# Patient Record
Sex: Female | Born: 1995 | Race: Black or African American | Hispanic: No | Marital: Single | Smoking: Never smoker
Health system: Southern US, Community
[De-identification: ages and names within clinical notes are randomized; demographics above are authoritative.]

## PROBLEM LIST (undated history)

## (undated) DIAGNOSIS — D649 Anemia, unspecified: Secondary | ICD-10-CM

---

## 2016-04-09 ENCOUNTER — Encounter (HOSPITAL_COMMUNITY): Payer: Self-pay | Admitting: Emergency Medicine

## 2016-04-09 ENCOUNTER — Emergency Department (HOSPITAL_COMMUNITY)
Admission: EM | Admit: 2016-04-09 | Discharge: 2016-04-09 | Disposition: A | Discharge status: Home / Self Care | Payer: BLUE CROSS/BLUE SHIELD | Attending: Emergency Medicine | Admitting: Emergency Medicine

## 2016-04-09 ENCOUNTER — Emergency Department (HOSPITAL_COMMUNITY): Payer: BLUE CROSS/BLUE SHIELD

## 2016-04-09 DIAGNOSIS — S4992XA Unspecified injury of left shoulder and upper arm, initial encounter: Secondary | ICD-10-CM | POA: Diagnosis present

## 2016-04-09 DIAGNOSIS — Y939 Activity, unspecified: Secondary | ICD-10-CM | POA: Diagnosis not present

## 2016-04-09 DIAGNOSIS — Y999 Unspecified external cause status: Secondary | ICD-10-CM | POA: Insufficient documentation

## 2016-04-09 DIAGNOSIS — Y92410 Unspecified street and highway as the place of occurrence of the external cause: Secondary | ICD-10-CM | POA: Diagnosis not present

## 2016-04-09 DIAGNOSIS — S40012A Contusion of left shoulder, initial encounter: Secondary | ICD-10-CM

## 2016-04-09 HISTORY — DX: Anemia, unspecified: D64.9

## 2016-04-09 MED ORDER — IBUPROFEN 400 MG PO TABS
600.0000 mg | ORAL_TABLET | Freq: Once | ORAL | Status: AC
  Administered 2016-04-09: 600 mg via ORAL
  Filled 2016-04-09: qty 1

## 2016-04-09 MED ORDER — LIDOCAINE 5 % EX PTCH
1.0000 | MEDICATED_PATCH | Freq: Once | CUTANEOUS | Status: DC
  Administered 2016-04-09: 1 via TRANSDERMAL
  Filled 2016-04-09: qty 1

## 2016-04-09 NOTE — ED Notes (Signed)
Pt returned from xray
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
## 2016-04-09 NOTE — Progress Notes (Signed)
Orthopedic Tech Progress Note Patient Details:  Kathy Contreras 1995-12-21 [id number]  Ortho Devices Type of Ortho Device: Sling immobilizer Ortho Device/Splint Location: lue Ortho Device/Splint Interventions: Ordered, Application   Trinna PostMartinez, [name] 04/09/2016, 4:24 AM
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
## 2018-09-14 ENCOUNTER — Other Ambulatory Visit (HOSPITAL_COMMUNITY): Payer: Self-pay | Admitting: Family Medicine

## 2018-09-14 ENCOUNTER — Other Ambulatory Visit: Payer: Self-pay | Admitting: Family Medicine

## 2018-09-14 DIAGNOSIS — E349 Endocrine disorder, unspecified: Secondary | ICD-10-CM

## 2018-09-15 ENCOUNTER — Other Ambulatory Visit: Payer: Self-pay

## 2018-09-15 ENCOUNTER — Ambulatory Visit (HOSPITAL_COMMUNITY)
Admission: RE | Admit: 2018-09-15 | Discharge: 2018-09-15 | Disposition: A | Discharge status: Home / Self Care | Payer: BC Managed Care – PPO | Source: Ambulatory Visit | Attending: Family Medicine | Admitting: Family Medicine

## 2018-09-15 DIAGNOSIS — E349 Endocrine disorder, unspecified: Secondary | ICD-10-CM | POA: Insufficient documentation

## 2018-09-23 ENCOUNTER — Inpatient Hospital Stay (HOSPITAL_COMMUNITY)
Admission: AD | Admit: 2018-09-23 | Discharge: 2018-09-23 | Disposition: A | Discharge status: Home / Self Care | Payer: BC Managed Care – PPO | Attending: Obstetrics and Gynecology | Admitting: Obstetrics and Gynecology

## 2018-09-23 ENCOUNTER — Encounter (HOSPITAL_COMMUNITY): Payer: Self-pay

## 2018-09-23 ENCOUNTER — Other Ambulatory Visit: Payer: Self-pay

## 2018-09-23 DIAGNOSIS — R109 Unspecified abdominal pain: Secondary | ICD-10-CM

## 2018-09-23 DIAGNOSIS — N939 Abnormal uterine and vaginal bleeding, unspecified: Secondary | ICD-10-CM | POA: Diagnosis present

## 2018-09-23 DIAGNOSIS — Z3202 Encounter for pregnancy test, result negative: Secondary | ICD-10-CM | POA: Diagnosis not present

## 2018-09-23 DIAGNOSIS — Z789 Other specified health status: Secondary | ICD-10-CM

## 2018-09-23 LAB — HCG, QUANTITATIVE, PREGNANCY: hCG, Beta Chain, Quant, S: 1 m[IU]/mL (ref <5)
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
## 2018-09-23 NOTE — Discharge Instructions (Signed)
Abdominal Pain, Adult    Many things can cause belly (abdominal) pain. Most times, belly pain is not dangerous. Many cases of belly pain can be watched and treated at home. Sometimes belly pain is serious, though. Your doctor will try to find the cause of your belly pain.  Follow these instructions at home:  · Take over-the-counter and prescription medicines only as told by your doctor. Do not take medicines that help you poop (laxatives) unless told to by your doctor.  · Drink enough fluid to keep your pee (urine) clear or pale yellow.  · Watch your belly pain for any changes.  · Keep all follow-up visits as told by your doctor. This is important.  Contact a doctor if:  · Your belly pain changes or gets worse.  · You are not hungry, or you lose weight without trying.  · You are having trouble pooping (constipated) or have watery poop (diarrhea) for more than 2-3 days.  · You have pain when you pee or poop.  · Your belly pain wakes you up at night.  · Your pain gets worse with meals, after eating, or with certain foods.  · You are throwing up and cannot keep anything down.  · You have a fever.  Get help right away if:  · Your pain does not go away as soon as your doctor says it should.  · You cannot stop throwing up.  · Your pain is only in areas of your belly, such as the right side or the left lower part of the belly.  · You have bloody or black poop, or poop that looks like tar.  · You have very bad pain, cramping, or bloating in your belly.  · You have signs of not having enough fluid or water in your body (dehydration), such as:  ? Dark pee, very little pee, or no pee.  ? Cracked lips.  ? Dry mouth.  ? Sunken eyes.  ? Sleepiness.  ? Weakness.  This information is not intended to replace advice given to you by your health care provider. Make sure you discuss any questions you have with your health care provider.  Document Released: 07/08/2007 Document Revised: 08/09/2015 Document Reviewed: 07/03/2015  Elsevier  Interactive Patient Education © 2020 [employer]

## 2018-09-23 NOTE — MAU Note (Signed)
Pt presents with c/o intermittent VB that began 09/16/2018.  Reports currently not bleeding.  LMP 08/08/2018.  States had negative HPT, but positive blood HCG in [place] hospital.

## 2018-09-23 NOTE — Progress Notes (Signed)
Darryll Capers, RN, [facility] Coverage into see pt per her request after receiving complete update regarding situation.

## 2018-09-26 LAB — POCT PREGNANCY, URINE: Preg Test, Ur: NEGATIVE

## 2020-02-23 IMAGING — US OBSTETRIC <14 WK US AND TRANSVAGINAL OB US
1 series · 15 of 28 positions shown · non-contrast
Comparison: None.

CLINICAL DATA: Abdominal pain.  Positive pregnancy test.

EXAM:
OBSTETRIC <14 WK US AND TRANSVAGINAL OB US
TECHNIQUE: Both transabdominal and transvaginal ultrasound examinations were
performed for complete evaluation of the gestation as well as the
maternal uterus, adnexal regions, and pelvic cul-de-sac.
Transvaginal technique was performed to assess early pregnancy.

[Series 1: obstetric <14 wk us and transvaginal ob us · 15 of 61 slices shown]
[im 1/61]
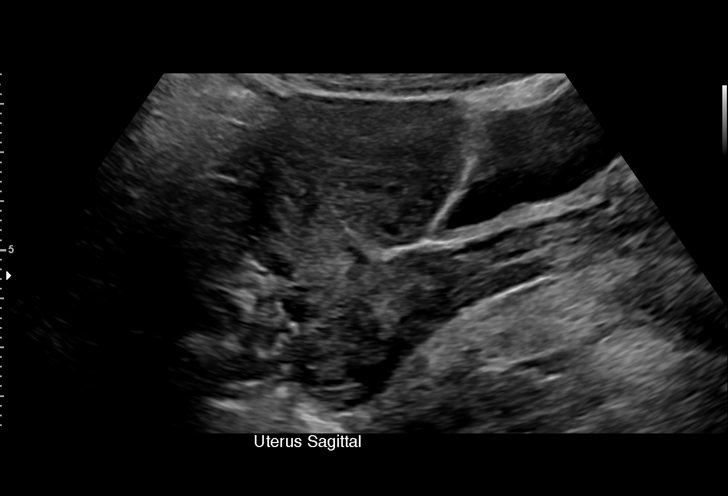
[im 5/61]
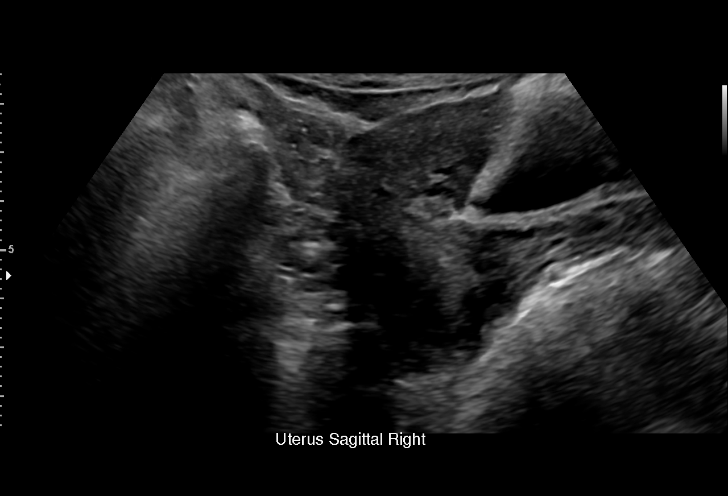
[im 9/61]
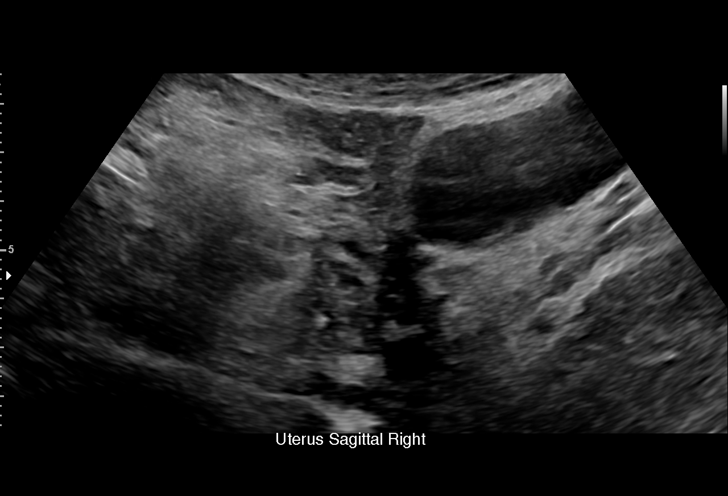
[im 14/61]
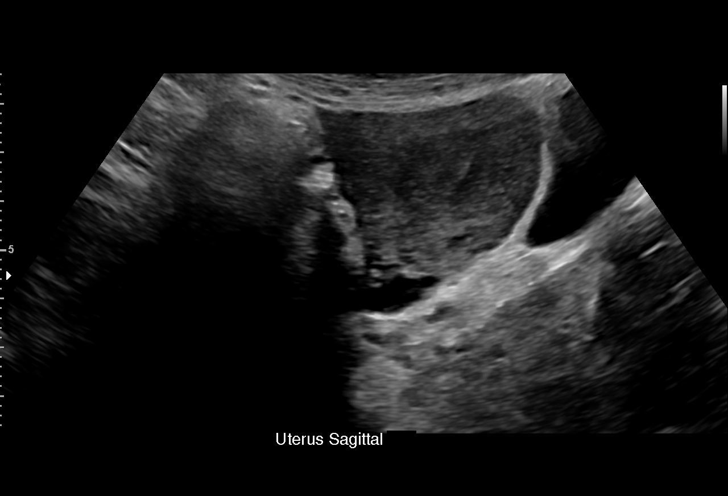
[im 18/61]
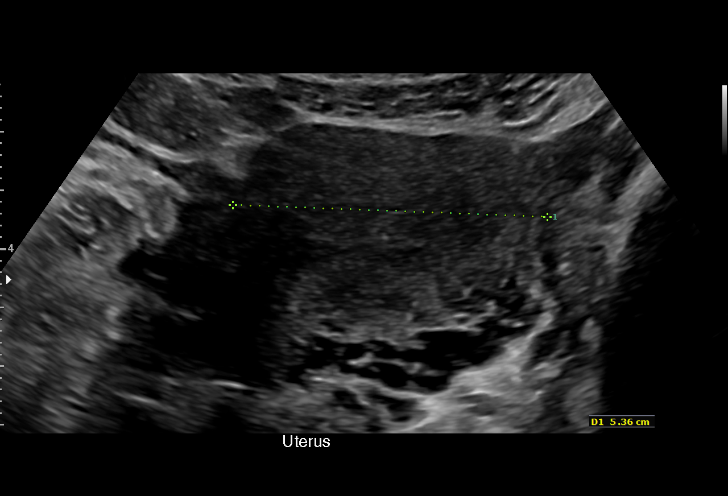
[im 23/61]
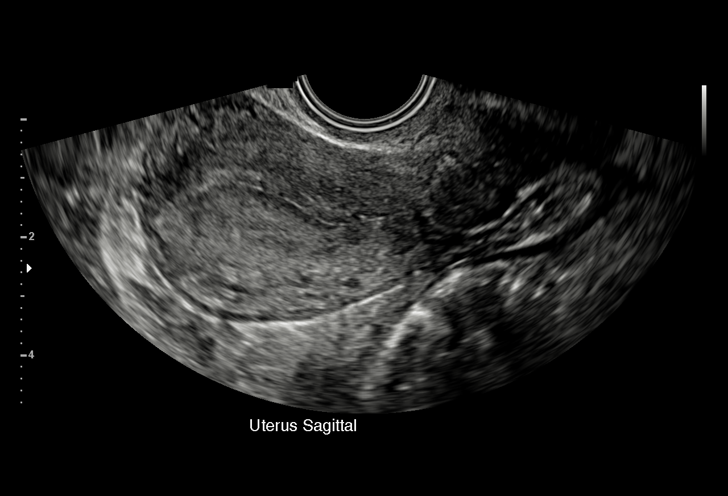
[im 27/61]
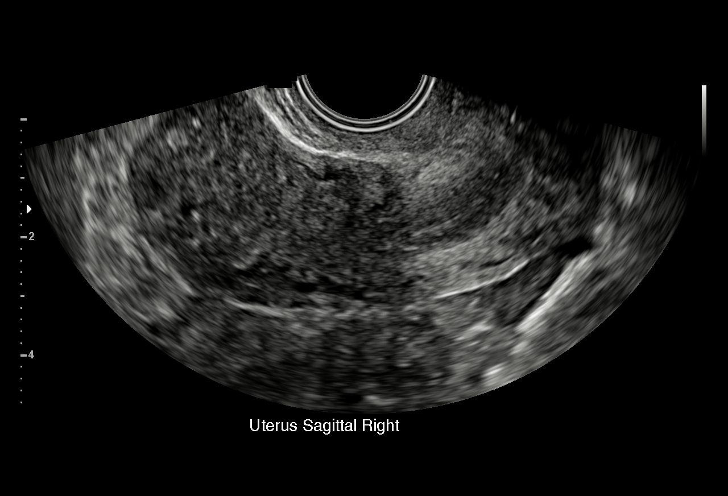
[im 32/61]
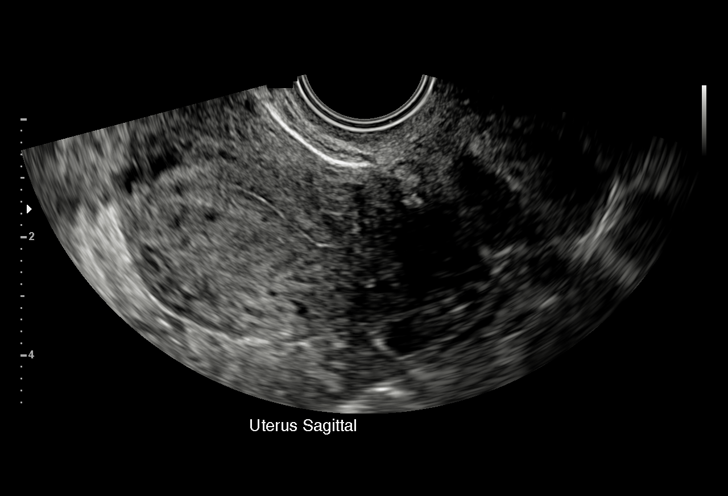
[im 34/61]
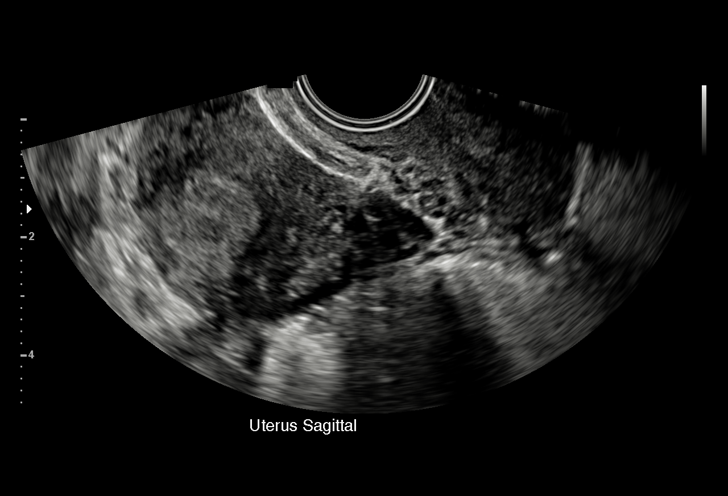
[im 38/61]
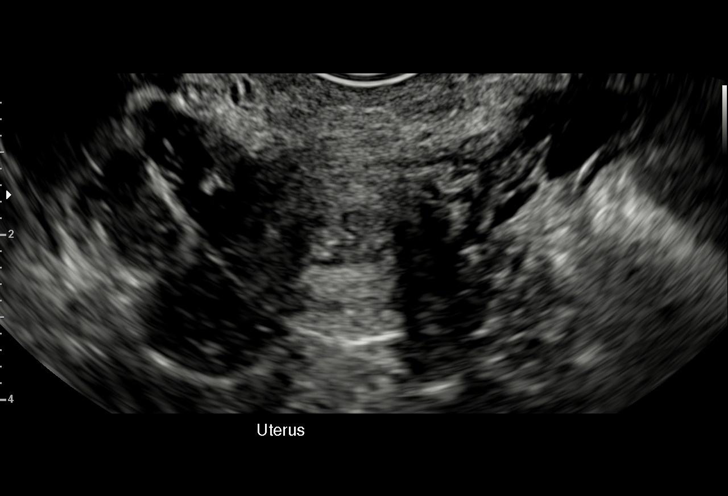
[im 43/61]
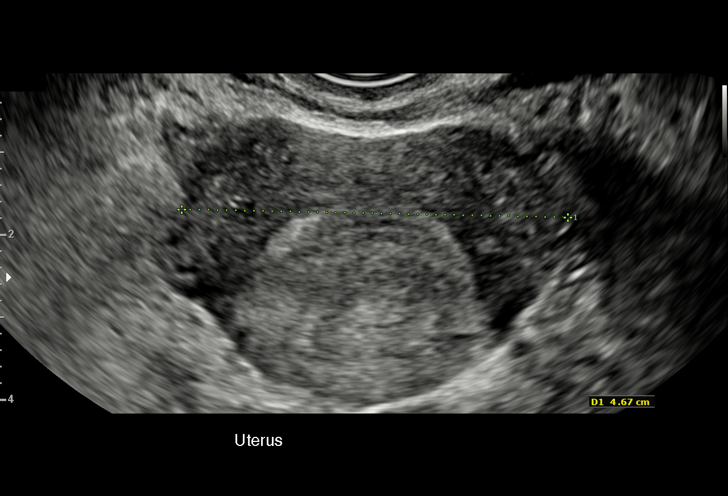
[im 47/61]
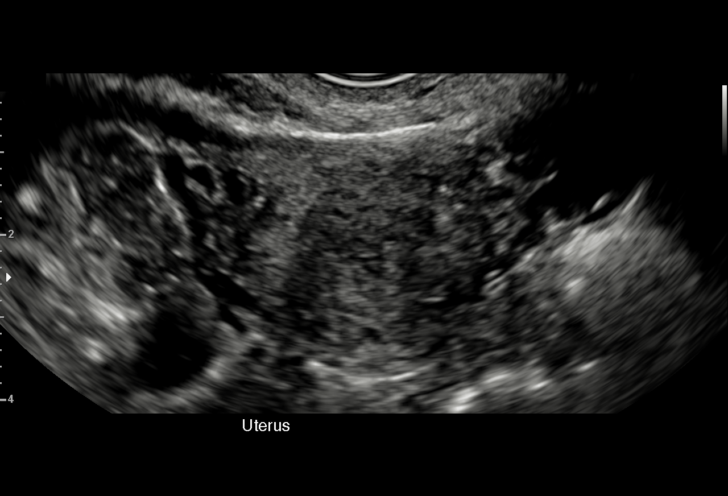
[im 52/61]
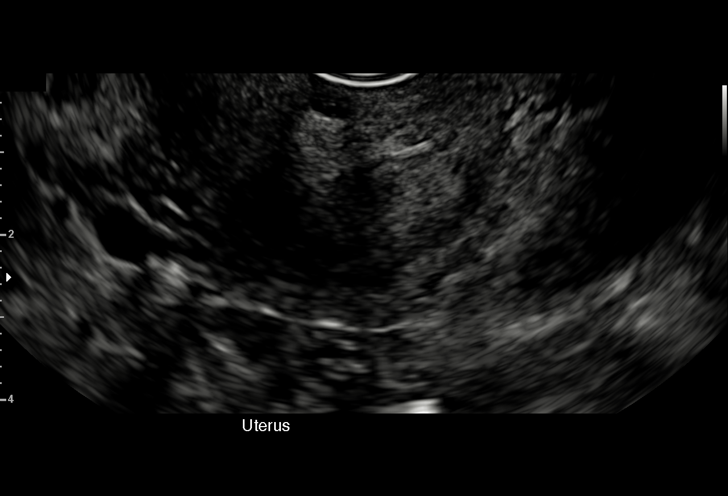
[im 56/61]
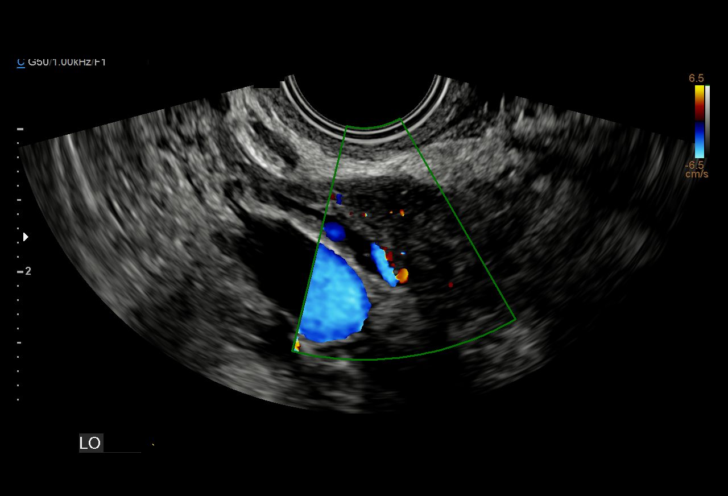
[im 61/61]
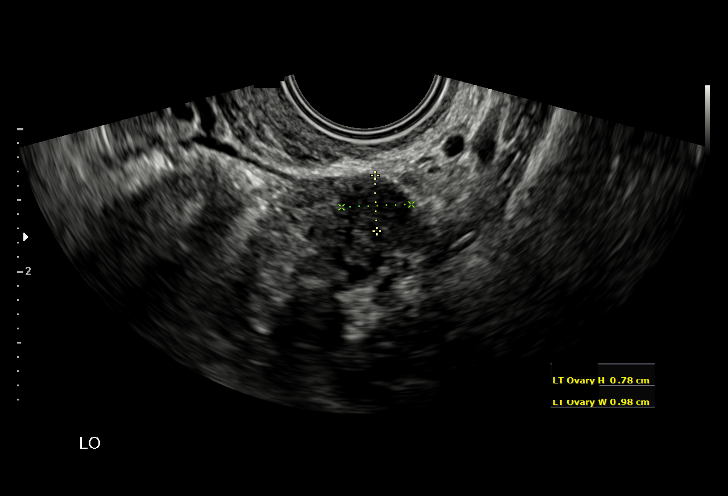

[15 of 28 positions shown; findings below may reference images not displayed]

FINDINGS: Intrauterine gestational sac: None

Maternal uterus/adnexae: Both ovaries are normal in appearance. No
mass or abnormal free fluid identified.
IMPRESSION: Pregnancy of unknown anatomic location (no intrauterine gestational
sac or adnexal mass identified). Differential diagnosis includes
recent spontaneous abortion, IUP too early to visualize, and
non-visualized ectopic pregnancy. Recommend correlation with serial
beta-hCG levels, and follow up US if warranted clinically.
